# Patient Record
Sex: Male | Born: 1995 | Race: Black or African American | Hispanic: No | Marital: Single | Smoking: Never smoker
Health system: Southern US, Community
[De-identification: ages and names within clinical notes are randomized; demographics above are authoritative.]

---
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
## 2014-09-07 ENCOUNTER — Emergency Department (HOSPITAL_COMMUNITY): Payer: No Typology Code available for payment source

## 2014-09-07 ENCOUNTER — Encounter (HOSPITAL_COMMUNITY): Payer: Self-pay

## 2014-09-07 ENCOUNTER — Emergency Department (HOSPITAL_COMMUNITY)
Admission: EM | Admit: 2014-09-07 | Discharge: 2014-09-08 | Disposition: A | Discharge status: Home / Self Care | Payer: No Typology Code available for payment source | Attending: Emergency Medicine | Admitting: Emergency Medicine

## 2014-09-07 DIAGNOSIS — N50819 Testicular pain, unspecified: Secondary | ICD-10-CM

## 2014-09-07 DIAGNOSIS — Z880 Allergy status to penicillin: Secondary | ICD-10-CM | POA: Insufficient documentation

## 2014-09-07 DIAGNOSIS — N508 Other specified disorders of male genital organs: Secondary | ICD-10-CM | POA: Insufficient documentation

## 2014-09-07 DIAGNOSIS — Z79899 Other long term (current) drug therapy: Secondary | ICD-10-CM | POA: Insufficient documentation

## 2014-09-07 LAB — URINALYSIS, ROUTINE W REFLEX MICROSCOPIC
Glucose, UA: NEGATIVE mg/dL
Hgb urine dipstick: NEGATIVE
Ketones, ur: 15 mg/dL — AB
Leukocytes, UA: NEGATIVE
Nitrite: NEGATIVE
Protein, ur: NEGATIVE mg/dL
Specific Gravity, Urine: 1.039 — ABNORMAL HIGH (ref 1.005–1.030)
Urobilinogen, UA: 1 mg/dL (ref 0.0–1.0)
pH: 5.5 (ref 5.0–8.0)

## 2014-09-07 LAB — I-STAT CHEM 8, ED
BUN: 13 mg/dL (ref 6–23)
Calcium, Ion: 1.23 mmol/L (ref 1.12–1.23)
Chloride: 105 mmol/L (ref 96–112)
Creatinine, Ser: 0.8 mg/dL (ref 0.50–1.35)
Glucose, Bld: 82 mg/dL (ref 70–99)
HCT: 43 % (ref 39.0–52.0)
Hemoglobin: 14.6 g/dL (ref 13.0–17.0)
Potassium: 3.7 mmol/L (ref 3.5–5.1)
Sodium: 142 mmol/L (ref 135–145)
TCO2: 23 mmol/L (ref 0–100)

## 2014-09-07 NOTE — ED Notes (Signed)
Pt reporting left testicle pain that started 3-4 weeks ago.  Pt sts pain has progressively gotten worse.  Sts going to the restroom helps relieve the pain.

## 2014-09-07 NOTE — ED Provider Notes (Signed)
Complains of pain at left posterior testicle onset 3 weeks ago intermittent improved with standing upright and with urinating. No fever. No discharge. No other associated symptoms. He is asymptomatic as I examine him on exam no distress abdomen is obese genitalia normal male testes and normal lie and nontender no masses palpated  Doug SouSam Vannie Hochstetler, MD 09/07/14 [phone]

## 2014-09-07 NOTE — ED Notes (Signed)
Called for triage x2 with no response 

## 2014-09-08 MED ORDER — CEFIXIME 400 MG PO TABS
400.0000 mg | ORAL_TABLET | Freq: Once | ORAL | Status: AC
  Administered 2014-09-08: 400 mg via ORAL
  Filled 2014-09-08: qty 1

## 2014-09-08 MED ORDER — DOXYCYCLINE HYCLATE 100 MG PO CAPS: 100.0000 mg | ORAL_CAPSULE | Freq: Two times a day (BID) | ORAL | Status: AC

## 2014-09-08 MED ORDER — HYDROCODONE-ACETAMINOPHEN 5-325 MG PO TABS: 1.0000 | ORAL_TABLET | Freq: Four times a day (QID) | ORAL | Status: DC | PRN

## 2014-09-08 NOTE — Discharge Instructions (Signed)
Return here as needed. Follow up with the urologist provided. Call Monday for appointment.

## 2014-09-08 NOTE — ED Provider Notes (Signed)
CSN: [id number]     Arrival date & time 09/07/14  1846 History   First MD Initiated Contact with Patient 09/07/14 2120     Chief Complaint  Patient presents with  . Testicle Pain     (Consider location/radiation/quality/duration/timing/severity/associated sxs/prior Treatment) HPI Patient presents to the emergency department with left testicular pain has been ongoing for 3 weeks.  The patient states that it improves intermittently.  Patient states it improves with standing and urinating.  Patient denies any penile discharge or hematuria.  He should states that palpation makes the pain worse.  Patient denies abdominal pain, nausea, vomiting, fever, weakness, dizziness, headache, blurred vision, neck pain, neck pain, chest pain or shortness of breath History reviewed. No pertinent past medical history. History reviewed. No pertinent past surgical history. History reviewed. No pertinent family history. History  Substance Use Topics  . Smoking status: Never Smoker   . Smokeless tobacco: Not on file  . Alcohol Use: No    Review of Systems  All other systems negative except as documented in the HPI. All pertinent positives and negatives as reviewed in the HPI.  Allergies  Amoxicillin  Home Medications   Prior to Admission medications   Medication Sig Start Date End Date Taking? Authorizing Provider  ibuprofen (ADVIL,MOTRIN) 200 MG tablet Take 400 mg by mouth every 6 (six) hours as needed for mild pain.   Yes Historical Provider, MD  albuterol (PROVENTIL HFA;VENTOLIN HFA) 108 (90 BASE) MCG/ACT inhaler Inhale 1-2 puffs into the lungs every 6 (six) hours as needed for wheezing. 07/29/11 07/28/12  Jimmie MollyPaolo Coll, MD  doxycycline (VIBRAMYCIN) 100 MG capsule Take 1 capsule (100 mg total) by mouth 2 (two) times daily. 09/08/14   Charlestine Nighthristopher Aubery Date, PA-C  fexofenadine (ALLEGRA) 60 MG tablet Take 1 tablet (60 mg total) by mouth 2 (two) times daily. 07/29/11 07/28/12  Jimmie MollyPaolo Coll, MD    HYDROcodone-acetaminophen (NORCO/VICODIN) 5-325 MG per tablet Take 1 tablet by mouth every 6 (six) hours as needed for moderate pain. 09/08/14   Charlestine Nighthristopher Srihari Shellhammer, PA-C  Naproxen Sodium (ALEVE PO) Take by mouth.    Historical Provider, MD   BP 116/64 mmHg  Pulse 79  Temp(Src) 97.9 F (36.6 C) (Oral)  Resp 16  Ht 5\' 10"  (1.778 m)  Wt 260 lb (117.935 kg)  BMI 37.31 kg/m2  SpO2 97% Physical Exam  Constitutional: He is oriented to person, place, and time. He appears well-developed. No distress.  HENT:  Head: Normocephalic and atraumatic.  Mouth/Throat: Oropharynx is clear and moist.  Eyes: Pupils are equal, round, and reactive to light.  Neck: Normal range of motion. Neck supple.  Cardiovascular: Normal rate, regular rhythm and normal heart sounds.  Exam reveals no gallop and no friction rub.   No murmur heard. Pulmonary/Chest: Effort normal and breath sounds normal.  Abdominal: Hernia confirmed negative in the left inguinal area.  Genitourinary: Penis normal.    Left testis shows swelling and tenderness. Left testis shows no mass. Left testis is descended.  Neurological: He is alert and oriented to person, place, and time. He exhibits normal muscle tone. Coordination normal.  Skin: Skin is warm and dry. No rash noted. No erythema.    ED Course  Procedures (including critical care time) Labs Review Labs Reviewed  URINALYSIS, ROUTINE W REFLEX MICROSCOPIC - Abnormal; Notable for the following:    Color, Urine AMBER (*)    Specific Gravity, Urine 1.039 (*)    Bilirubin Urine SMALL (*)    Ketones, ur 15 (*)  All other components within normal limits  I-STAT CHEM 8, ED    Imaging Review US Scrotum  09/07/2014   CLINICAL DATA:  Left testicular pain for several weeks. Initial encounter.  EXAM: SCROTAL ULTRASOUND  DOPPLER ULTRASOUND OF THE TESTICLES  TECHNIQUE: Complete ultrasound examination of the testicles, epididymis, and other scrotal structures was performed. Color and  spectral Doppler ultrasound were also utilized to evaluate blood flow to the testicles.  COMPARISON:  None.  FINDINGS: Right testicle  Measurements: 4.8 x 2.5 x 3.4 cm. No mass or microlithiasis visualized.  Left testicle  Measurements: 4.3 x 2.5 x 3.0 cm. Several small hypoechoic foci are noted within the left testis, measuring up to 3 mm in size. No abnormal blood flow is noted on limited Doppler evaluation. This appearance can be seen with testicular fibrosis or possibly multiple small infarcts, though tumor cannot be excluded. Sarcoidosis is thought to be less likely given the unilateral nature of the finding.  Right epididymis:  Normal in size and appearance.  Left epididymis:  Normal in size and appearance.  Hydrocele:  None visualized.  Varicocele:  None visualized.  There appears to be a few prominent left scrotal varices.  Pulsed Doppler interrogation of both testes demonstrates normal low resistance arterial and venous waveforms bilaterally.  IMPRESSION: 1. No evidence of testicular torsion. 2. Several small hypoechoic foci seen within the left testis, measuring up to 3 mm in size. No associated abnormal blood flow is seen. This appearance can be seen with testicular fibrosis or possibly multiple small infarcts, though tumor cannot be excluded. Sarcoidosis is thought to be less likely given the unilateral nature of the finding. Would correlate for any history of recent trauma. MRI would be helpful for further evaluation, as deemed clinically appropriate. 3. Apparent prominent left scrotal varices noted.   Electronically Signed   By: Roanna Raider M.D.   On: 09/07/2014 23:09   [place] Art/ven Flow Abd Pelv Doppler  09/07/2014   CLINICAL DATA:  Left testicular pain for several weeks. Initial encounter.  EXAM: SCROTAL ULTRASOUND  DOPPLER ULTRASOUND OF THE TESTICLES  TECHNIQUE: Complete ultrasound examination of the testicles, epididymis, and other scrotal structures was performed. Color and spectral Doppler  ultrasound were also utilized to evaluate blood flow to the testicles.  COMPARISON:  None.  FINDINGS: Right testicle  Measurements: 4.8 x 2.5 x 3.4 cm. No mass or microlithiasis visualized.  Left testicle  Measurements: 4.3 x 2.5 x 3.0 cm. Several small hypoechoic foci are noted within the left testis, measuring up to 3 mm in size. No abnormal blood flow is noted on limited Doppler evaluation. This appearance can be seen with testicular fibrosis or possibly multiple small infarcts, though tumor cannot be excluded. Sarcoidosis is thought to be less likely given the unilateral nature of the finding.  Right epididymis:  Normal in size and appearance.  Left epididymis:  Normal in size and appearance.  Hydrocele:  None visualized.  Varicocele:  None visualized.  There appears to be a few prominent left scrotal varices.  Pulsed Doppler interrogation of both testes demonstrates normal low resistance arterial and venous waveforms bilaterally.  IMPRESSION: 1. No evidence of testicular torsion. 2. Several small hypoechoic foci seen within the left testis, measuring up to 3 mm in size. No associated abnormal blood flow is seen. This appearance can be seen with testicular fibrosis or possibly multiple small infarcts, though tumor cannot be excluded. Sarcoidosis is thought to be less likely given the unilateral nature of the finding.  Would correlate for any history of recent trauma. MRI would be helpful for further evaluation, as deemed clinically appropriate. 3. Apparent prominent left scrotal varices noted.   Electronically Signed   By: Roanna Raider M.D.   On: 09/07/2014 23:09     EKG Interpretation None      MDM   Final diagnoses:  Testicle pain   Patient will be treated for epididymitis/orchitis talked with the urologist on-call, Dr. Marlou Porch, and he will see the patient in follow-up.  He also agreed with the plan to treat for epididymitis    Charlestine Night, PA-C 09/10/14 1610  Doug Sou,  MD 09/12/14 [phone]

## 2020-01-30 ENCOUNTER — Other Ambulatory Visit: Payer: Self-pay | Admitting: Family Medicine

## 2020-01-30 DIAGNOSIS — K429 Umbilical hernia without obstruction or gangrene: Secondary | ICD-10-CM

## 2020-02-09 ENCOUNTER — Other Ambulatory Visit: Payer: Self-pay

## 2020-02-24 ENCOUNTER — Emergency Department (HOSPITAL_COMMUNITY)
Admission: EM | Admit: 2020-02-24 | Discharge: 2020-02-24 | Disposition: A | Discharge status: Home / Self Care | Payer: 59 | Attending: Emergency Medicine | Admitting: Emergency Medicine

## 2020-02-24 ENCOUNTER — Encounter (HOSPITAL_COMMUNITY): Payer: Self-pay | Admitting: Emergency Medicine

## 2020-02-24 ENCOUNTER — Other Ambulatory Visit: Payer: Self-pay

## 2020-02-24 ENCOUNTER — Emergency Department (HOSPITAL_COMMUNITY): Payer: 59

## 2020-02-24 DIAGNOSIS — K4091 Unilateral inguinal hernia, without obstruction or gangrene, recurrent: Secondary | ICD-10-CM | POA: Diagnosis not present

## 2020-02-24 DIAGNOSIS — K439 Ventral hernia without obstruction or gangrene: Secondary | ICD-10-CM

## 2020-02-24 DIAGNOSIS — K8020 Calculus of gallbladder without cholecystitis without obstruction: Secondary | ICD-10-CM

## 2020-02-24 DIAGNOSIS — Z79899 Other long term (current) drug therapy: Secondary | ICD-10-CM | POA: Diagnosis not present

## 2020-02-24 DIAGNOSIS — R109 Unspecified abdominal pain: Secondary | ICD-10-CM | POA: Diagnosis present

## 2020-02-24 DIAGNOSIS — K429 Umbilical hernia without obstruction or gangrene: Secondary | ICD-10-CM | POA: Insufficient documentation

## 2020-02-24 DIAGNOSIS — K4090 Unilateral inguinal hernia, without obstruction or gangrene, not specified as recurrent: Secondary | ICD-10-CM

## 2020-02-24 DIAGNOSIS — E669 Obesity, unspecified: Secondary | ICD-10-CM | POA: Diagnosis not present

## 2020-02-24 LAB — COMPREHENSIVE METABOLIC PANEL
ALT: 18 U/L (ref 0–44)
AST: 16 U/L (ref 15–41)
Albumin: 4.2 g/dL (ref 3.5–5.0)
Alkaline Phosphatase: 74 U/L (ref 38–126)
Anion gap: 9 (ref 5–15)
BUN: 10 mg/dL (ref 6–20)
CO2: 25 mmol/L (ref 22–32)
Calcium: 9.4 mg/dL (ref 8.9–10.3)
Chloride: 105 mmol/L (ref 98–111)
Creatinine, Ser: 0.79 mg/dL (ref 0.61–1.24)
GFR calc Af Amer: >60 mL/min (ref >60)
GFR calc non Af Amer: >60 mL/min (ref >60)
Glucose, Bld: 92 mg/dL (ref 70–99)
Potassium: 3.9 mmol/L (ref 3.5–5.1)
Sodium: 139 mmol/L (ref 135–145)
Total Bilirubin: 0.7 mg/dL (ref 0.3–1.2)
Total Protein: 7.5 g/dL (ref 6.5–8.1)

## 2020-02-24 LAB — CBC
HCT: 44.8 % (ref 39.0–52.0)
Hemoglobin: 14 g/dL (ref 13.0–17.0)
MCH: 27.9 pg (ref 26.0–34.0)
MCHC: 31.3 g/dL (ref 30.0–36.0)
MCV: 89.2 fL (ref 80.0–100.0)
Platelets: 397 10*3/uL (ref 150–400)
RBC: 5.02 MIL/uL (ref 4.22–5.81)
RDW: 12 % (ref 11.5–15.5)
WBC: 6.3 10*3/uL (ref 4.0–10.5)
nRBC: 0 % (ref 0.0–0.2)

## 2020-02-24 LAB — LIPASE, BLOOD: Lipase: 21 U/L (ref 11–51)

## 2020-02-24 MED ORDER — HYDROCODONE-ACETAMINOPHEN 5-325 MG PO TABS: 1.0000 | ORAL_TABLET | Freq: Four times a day (QID) | ORAL | 0 refills | Status: AC | PRN

## 2020-02-24 MED ORDER — IOHEXOL 300 MG/ML  SOLN
100.0000 mL | Freq: Once | INTRAMUSCULAR | Status: AC | PRN
  Administered 2020-02-24: 100 mL via INTRAVENOUS

## 2020-02-24 MED ORDER — OMEPRAZOLE 20 MG PO CPDR: 20.0000 mg | DELAYED_RELEASE_CAPSULE | Freq: Every day | ORAL | 0 refills | Status: AC

## 2020-02-24 NOTE — ED Triage Notes (Signed)
C/o upper abd pain x 2 weeks.  States over the last 3 days pain is now generalized.  Denies nausea, vomiting, and diarrhea.

## 2020-02-24 NOTE — ED Notes (Signed)
Updated on wait for treatment room. 

## 2020-02-24 NOTE — ED Provider Notes (Signed)
[facility] EMERGENCY DEPARTMENT Provider Note   CSN: [id number] Arrival date & time: 02/24/20  1424     History Chief Complaint  Patient presents with  . Abdominal Pain    Patrick Parks is a 24 y.o. male without significant past medical history, presenting to the emergency department with complaint of epigastric abdominal pain that has been ongoing for few months now, however worsened 2 days ago.  Pain is described as a gas cramping pain. He also has some indigestion. He states his symptoms are worse after eating a large meal.   He denies nausea, vomiting, heartburn, diarrhea, constipation, urinary symptoms, fevers. No medications taken for symptoms. He was being worked up outpatient by his PCP for this pain.  He states he had an ultrasound that was not forthcoming.  He was scheduled for an outpatient CT scan, however something happened with the imaging place and his imaging was canceled and not rescheduled.  His PCP mentions something about a hernia, however he is unsure of any more detail of this.    The history is provided by the patient.       History reviewed. No pertinent past medical history.  There are no problems to display for this patient.   History reviewed. No pertinent surgical history.     No family history on file.  Social History   Tobacco Use  . Smoking status: Never Smoker  . Smokeless tobacco: Never Used  Substance Use Topics  . Alcohol use: No  . Drug use: No    Home Medications Prior to Admission medications   Medication Sig Start Date End Date Taking? Authorizing Provider  albuterol (PROVENTIL HFA;VENTOLIN HFA) 108 (90 BASE) MCG/ACT inhaler Inhale 1-2 puffs into the lungs every 6 (six) hours as needed for wheezing. 07/29/11 07/28/12  Jimmie Molly, MD  doxycycline (VIBRAMYCIN) 100 MG capsule Take 1 capsule (100 mg total) by mouth 2 (two) times daily. 09/08/14   Lawyer, Cristal Deer, PA-C  fexofenadine (ALLEGRA) 60 MG tablet Take 1  tablet (60 mg total) by mouth 2 (two) times daily. 07/29/11 07/28/12  Jimmie Molly, MD  HYDROcodone-acetaminophen (NORCO/VICODIN) 5-325 MG tablet Take 1 tablet by mouth every 6 (six) hours as needed for severe pain. 02/24/20   Jarick Harkins, [place] N, PA-C  ibuprofen (ADVIL,MOTRIN) 200 MG tablet Take 400 mg by mouth every 6 (six) hours as needed for mild pain.    [provider]  Naproxen Sodium (ALEVE PO) Take by mouth.    [provider]  omeprazole (PRILOSEC) 20 MG capsule Take 1 capsule (20 mg total) by mouth daily. 02/24/20   Oveda Dadamo, [place] N, PA-C    Allergies    Amoxicillin  Review of Systems   Review of Systems  Gastrointestinal: Positive for abdominal pain.  All other systems reviewed and are negative.   Physical Exam Updated Vital Signs BP (!) 133/91   Pulse 86   Temp 99 F (37.2 C)   Resp (!) 22   SpO2 98%   Physical Exam Vitals and nursing note reviewed.  Constitutional:      General: He is not in acute distress.    Appearance: He is well-developed. He is obese.  HENT:     Head: Normocephalic and atraumatic.  Eyes:     Conjunctiva/sclera: Conjunctivae normal.  Cardiovascular:     Rate and Rhythm: Normal rate and regular rhythm.  Pulmonary:     Effort: Pulmonary effort is normal. No respiratory distress.     Breath sounds: Normal breath  sounds.  Abdominal:     General: Bowel sounds are normal.     Palpations: Abdomen is soft.     Tenderness: There is abdominal tenderness in the right upper quadrant and epigastric area. There is no guarding or rebound.  Skin:    General: Skin is warm.  Neurological:     Mental Status: He is alert.  Psychiatric:        Behavior: Behavior normal.     ED Results / Procedures / Treatments   Labs (all labs ordered are listed, but only abnormal results are displayed) Labs Reviewed  LIPASE, BLOOD  COMPREHENSIVE METABOLIC PANEL  CBC    EKG None  Radiology CT Abdomen Pelvis W Contrast  Result Date:  02/24/2020 CLINICAL DATA:  Acute upper abdominal pain EXAM: CT ABDOMEN AND PELVIS WITH CONTRAST TECHNIQUE: Multidetector CT imaging of the abdomen and pelvis was performed using the standard protocol following bolus administration of intravenous contrast. Sagittal and coronal MPR images reconstructed from axial data set. CONTRAST:  OMNIPAQUE IOHEXOL 300 MG/ML SOLN IV. No oral contrast. COMPARISON:  None FINDINGS: Lower chest: Lung bases clear Hepatobiliary: Multiple dependent calcified gallstones in gallbladder. Nonspecific 9 mm low-attenuation focus RIGHT lobe liver image 15. Gallbladder and liver otherwise normal appearance. No biliary dilatation. Pancreas: Normal appearance Spleen: Normal appearance Adrenals/Urinary Tract: Adrenal glands, kidneys, ureters, and bladder normal appearance Stomach/Bowel: Normal appendix. Slightly prominent stool at the rectosigmoid colon. Stomach and bowel loops otherwise normal appearance. Vascular/Lymphatic: Aorta normal caliber. Vascular structures patent. No adenopathy. Reproductive: Unremarkable prostate gland Other: Small LEFT inguinal hernia containing fat. Small umbilical hernia containing fat. Larger supraumbilical ventral hernia containing fat, with mild associated stranding at the properitoneal fat immediately deep to the fascial defect, could represent a degree of mild edema/inflammation. No free air or free fluid. Musculoskeletal: Osseous structures unremarkable. IMPRESSION: Cholelithiasis. Small LEFT inguinal and umbilical hernias containing fat. Larger supraumbilical ventral hernia containing fat, with mild associated stranding at the properitoneal fat immediately deep to the fascial defect, could represent a degree of edema/inflammation. No other intra-abdominal or intrapelvic abnormalities. Electronically Signed   By: Ulyses Southward M.D.   On: 02/24/2020 19:06    Procedures Procedures (including critical care time)  Medications Ordered in ED Medications    iohexol (OMNIPAQUE) 300 MG/ML solution 100 mL (100 mLs Intravenous Contrast Given 02/24/20 1838)    ED Course  I have reviewed the triage vital signs and the nursing notes.  Pertinent labs & imaging results that were available during my care of the patient were reviewed by me and considered in my medical decision making (see chart for details).    MDM Rules/Calculators/A&P                          Patient presenting with upper abdominal pain, worse after large meals.  This has been ongoing for multiple months, worked up outpatient.  He was pending outpatient CT scan, however something abnormal scheduling and has not been done.  He is not have any vomiting or bowel changes, no urinary symptoms or fevers.  On exam he is well-appearing and in no distress.  He is morbidly obese.  Abdomen with tenderness to the epigastrium and right upper quadrant, no peritoneal signs.  Labs are reassuring and within normal limits including lipase, LFTs, renal function, normal white count. As patient said he had outpatient ultrasound done, will perform CT scan for further evaluation of unknown hernia.  Differential diagnosis does include cholelithiasis,  gastritis, PUD.  CT with fat-containing umbilical hernia, ventral hernia, left inguinal hernia.  There is also cholelithiasis without evidence of cholecystitis.  No other acute findings.  Suspect most of symptoms are related to cholelithiasis given history.  He is tender at the site of the ventral hernia.  Will provide general surgery referral to discuss possible repair of hernia, as well as to discuss cholelithiasis.  Patient will be prescribed pain medication pain related to cholelithiasis.  He is instructed of diet modifications.  We will also prescribe PPI.  Patient is instructed to return if symptoms worsen.  Patient is well-appearing, agreeable to plan and appropriate for discharge.  Final Clinical Impression(s) / ED Diagnoses Final diagnoses:  Calculus of  gallbladder without cholecystitis without obstruction  Ventral hernia without obstruction or gangrene  Umbilical hernia without obstruction and without gangrene  Left inguinal hernia    Rx / DC Orders ED Discharge Orders         Ordered    omeprazole (PRILOSEC) 20 MG capsule  Daily        02/24/20 1935    HYDROcodone-acetaminophen (NORCO/VICODIN) 5-325 MG tablet  Every 6 hours PRN        02/24/20 1935           Lasheka Kempner, [place] N, PA-C 02/24/20 2017    Wynetta Fines, MD 02/27/20 [phone]

## 2020-02-24 NOTE — Discharge Instructions (Signed)
It is recommended that you modify your diet to help with symptoms of gallstones; do not eat very large meals, limit your greasy/fatty meals. You can treat your pain with over-the-counter medications as needed. You can take the hydrocodone for severe pain.  Be aware this medication can make you drowsy. Do not operate motor vehicles or drink alcohol while taking this medication. Take the Prilosec/omeprazole daily to help with acid reflux. Schedule appointment with general surgery to discuss treatment options of your gallstones and hernias. Return to the emergency department if you have sudden worsening pain associated with your hernias, the stop having bowel movements or have uncontrollable vomiting.  Return if you have fever, worsening pain in the right upper abdomen that is not controlled with pain medication, uncontrollable vomiting.

## 2021-06-30 IMAGING — CT CT ABD-PELV W/ CM
2 of 4 series · 16 of 46 positions shown, 18 images · IV contrast (omnipaque)
Comparison: None

CLINICAL DATA: Acute upper abdominal pain

EXAM:
CT ABDOMEN AND PELVIS WITH CONTRAST
TECHNIQUE: Multidetector CT imaging of the abdomen and pelvis was performed
using the standard protocol following bolus administration of
intravenous contrast. Sagittal and coronal MPR images reconstructed
from axial data set.
CONTRAST:  100mL OMNIPAQUE IOHEXOL 300 MG/ML SOLN IV. No oral
contrast.

[Series 3: a/p w/ 5mm · axial · 0.98mm/px · z∈[-579,-84]mm · 13 of 109 slices shown, 15 images]
[im 5/109  soft-tissue]
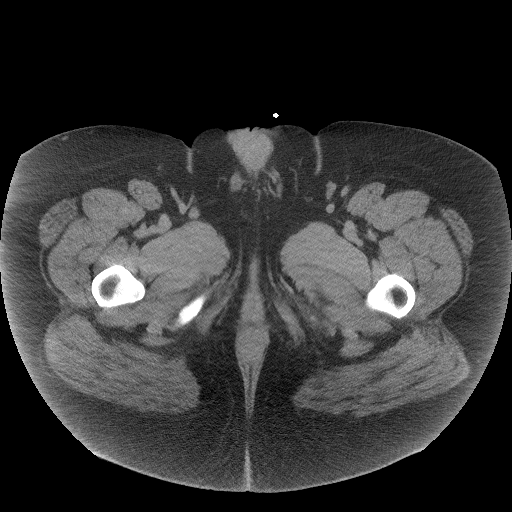
[im 5/109  bone]
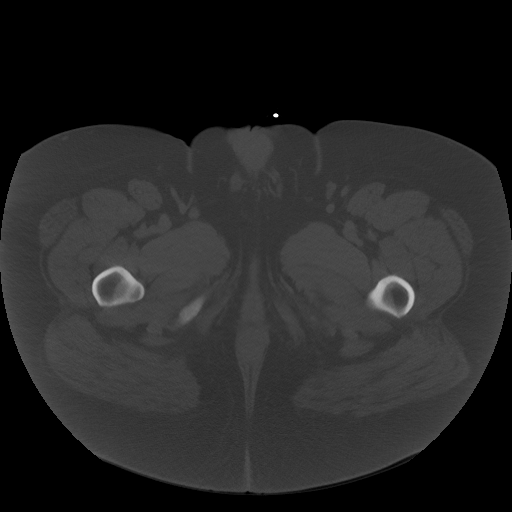
[im 13/109  soft-tissue]
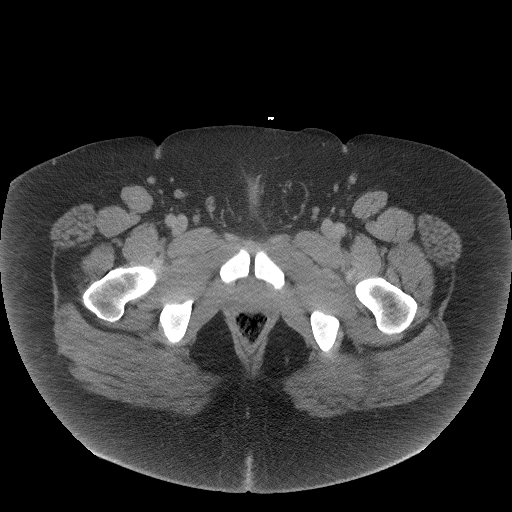
[im 22/109  soft-tissue]
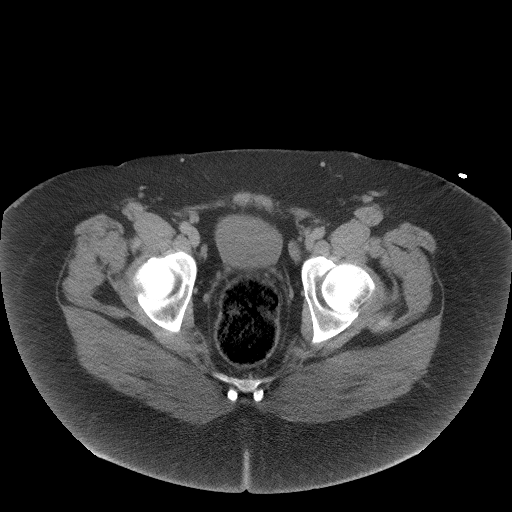
[im 31/109  soft-tissue]
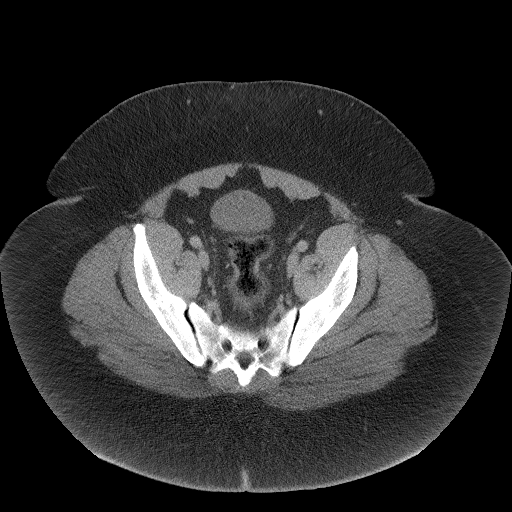
[im 39/109  soft-tissue]
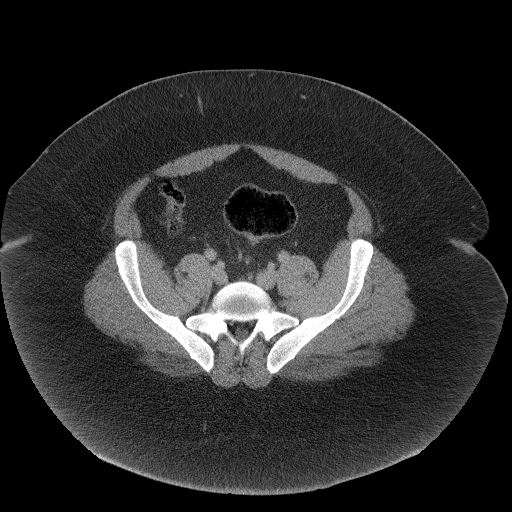
[im 48/109  soft-tissue]
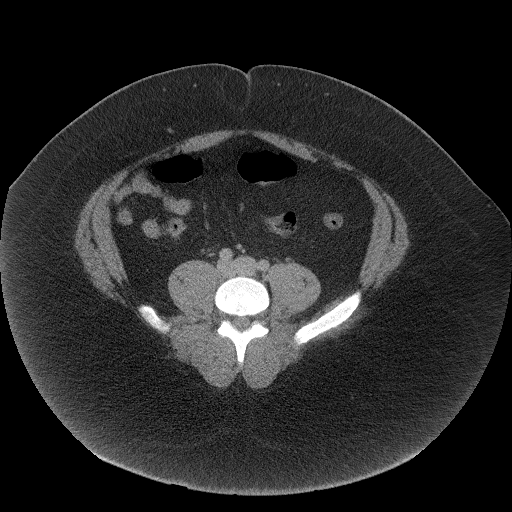
[im 57/109  soft-tissue]
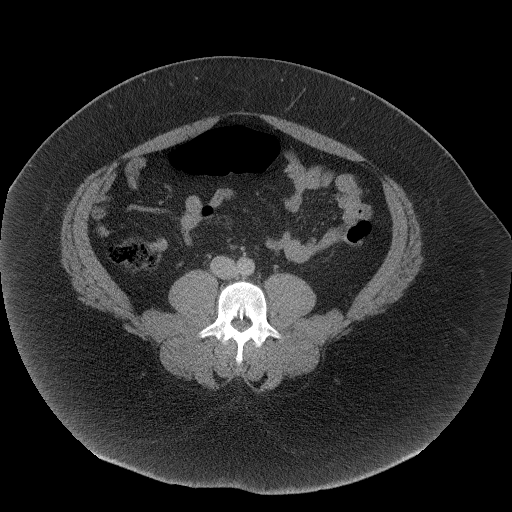
[im 61/109  soft-tissue]
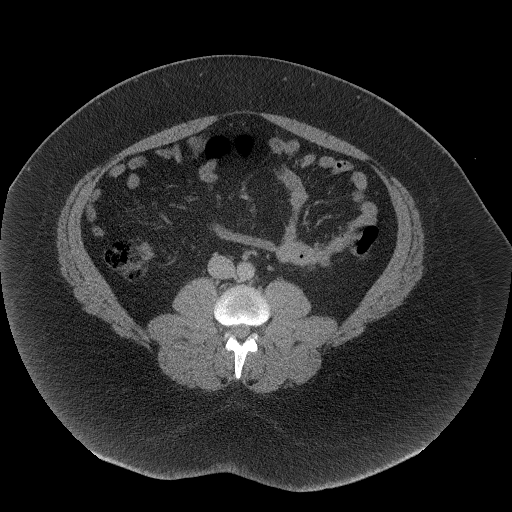
[im 70/109  soft-tissue]
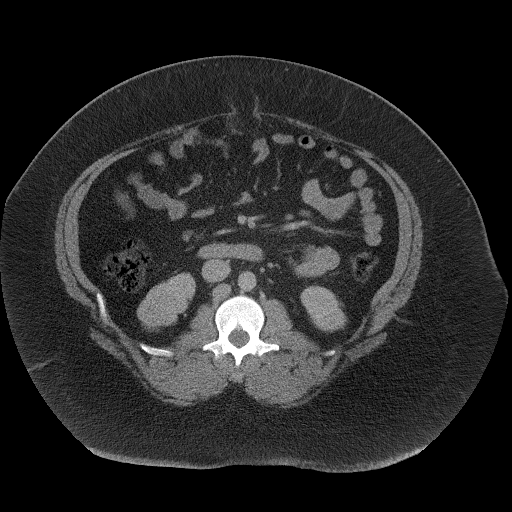
[im 70/109  bone]
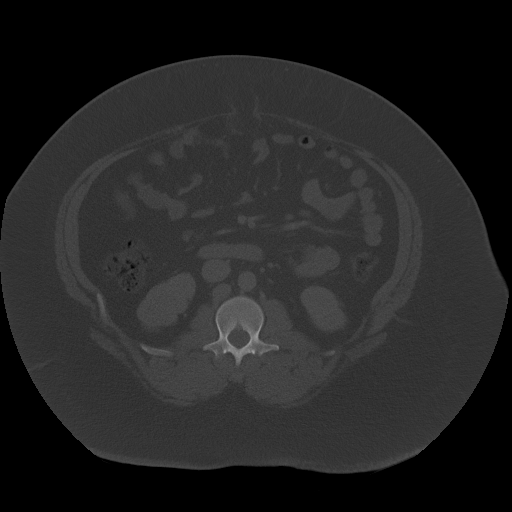
[im 78/109  soft-tissue]
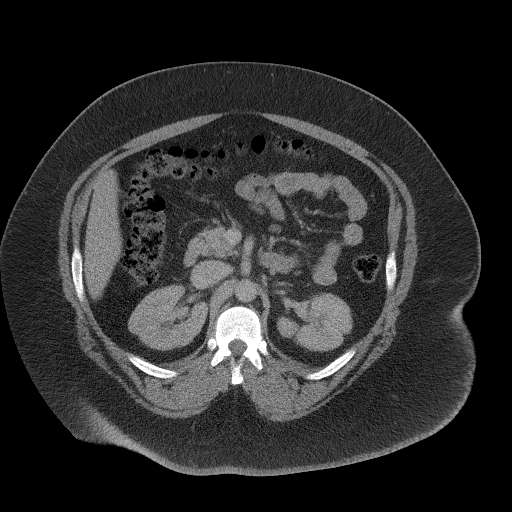
[im 87/109  soft-tissue]
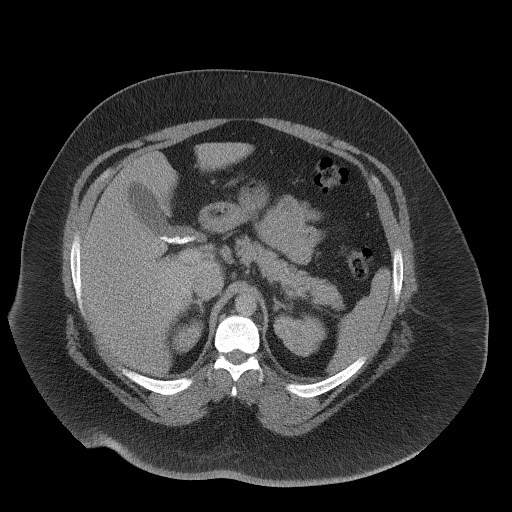
[im 96/109  soft-tissue]
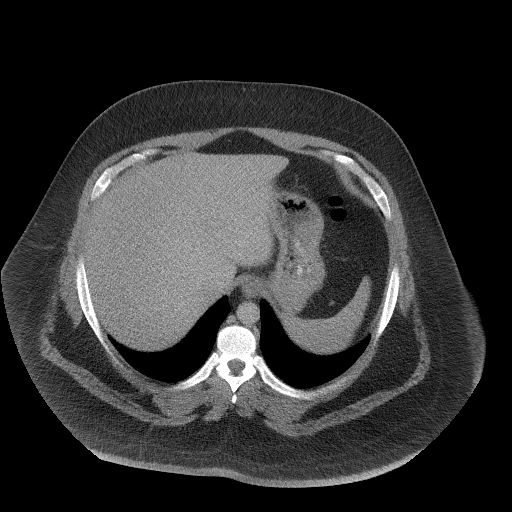
[im 104/109  soft-tissue]
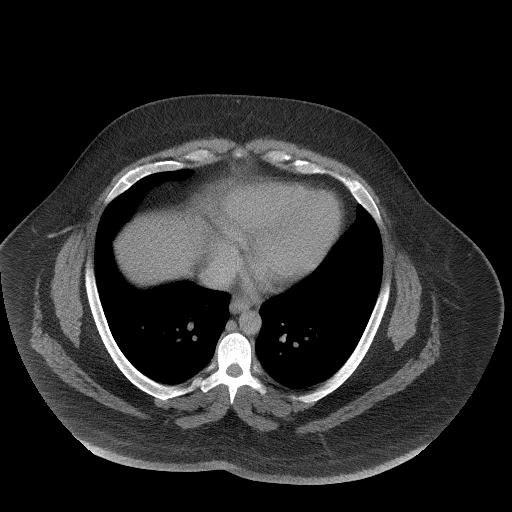

[Series 6: a/p w/ cor · coronal · 0.95mm/px · 3 of 188 slices shown]
[im 63/188  soft-tissue]
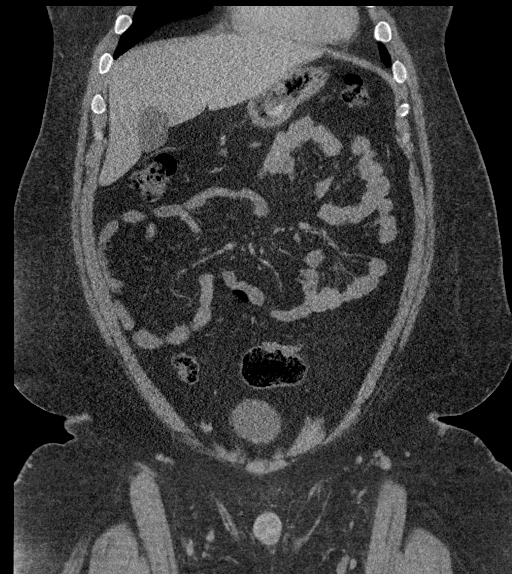
[im 84/188  soft-tissue]
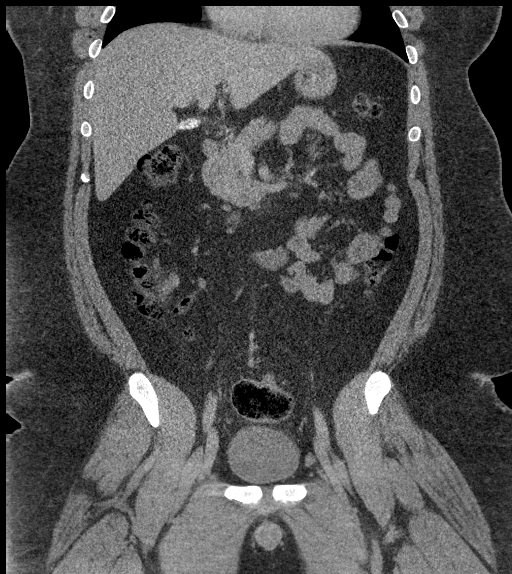
[im 104/188  soft-tissue]
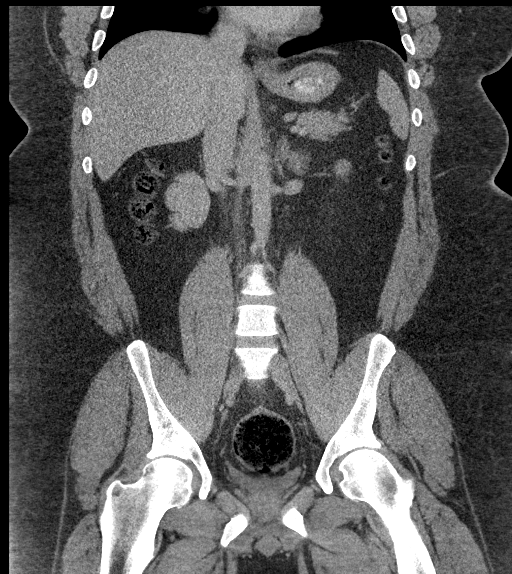

[16 of 46 positions shown; findings below may reference images not displayed]

FINDINGS: Lower chest: Lung bases clear

Hepatobiliary: Multiple dependent calcified gallstones in
gallbladder. Nonspecific 9 mm low-attenuation focus RIGHT lobe liver
image 15. Gallbladder and liver otherwise normal appearance. No
biliary dilatation.

Pancreas: Normal appearance

Spleen: Normal appearance

Adrenals/Urinary Tract: Adrenal glands, kidneys, ureters, and
bladder normal appearance

Stomach/Bowel: Normal appendix. Slightly prominent stool at the
rectosigmoid colon. Stomach and bowel loops otherwise normal
appearance.

Vascular/Lymphatic: Aorta normal caliber. Vascular structures
patent. No adenopathy.

Reproductive: Unremarkable prostate gland

Other: Small LEFT inguinal hernia containing fat. Small umbilical
hernia containing fat. Larger supraumbilical ventral hernia
containing fat, with mild associated stranding at the properitoneal
fat immediately deep to the fascial defect, could represent a degree
of mild edema/inflammation. No free air or free fluid.

Musculoskeletal: Osseous structures unremarkable.
IMPRESSION: Cholelithiasis.

Small LEFT inguinal and umbilical hernias containing fat.

Larger supraumbilical ventral hernia containing fat, with mild
associated stranding at the properitoneal fat immediately deep to
the fascial defect, could represent a degree of edema/inflammation.

No other intra-abdominal or intrapelvic abnormalities.

## 2021-07-02 DIAGNOSIS — R3589 Other polyuria: Secondary | ICD-10-CM | POA: Diagnosis not present

## 2021-07-02 DIAGNOSIS — Z6841 Body Mass Index (BMI) 40.0 and over, adult: Secondary | ICD-10-CM | POA: Diagnosis not present

## 2021-07-02 DIAGNOSIS — K429 Umbilical hernia without obstruction or gangrene: Secondary | ICD-10-CM | POA: Diagnosis not present

## 2021-07-02 DIAGNOSIS — G4730 Sleep apnea, unspecified: Secondary | ICD-10-CM | POA: Diagnosis not present

## 2021-07-02 DIAGNOSIS — I10 Essential (primary) hypertension: Secondary | ICD-10-CM | POA: Diagnosis not present

## 2021-07-02 DIAGNOSIS — Z1159 Encounter for screening for other viral diseases: Secondary | ICD-10-CM | POA: Diagnosis not present

## 2021-07-02 DIAGNOSIS — R7309 Other abnormal glucose: Secondary | ICD-10-CM | POA: Diagnosis not present

## 2021-07-02 DIAGNOSIS — E559 Vitamin D deficiency, unspecified: Secondary | ICD-10-CM | POA: Diagnosis not present

## 2022-01-09 DIAGNOSIS — R399 Unspecified symptoms and signs involving the genitourinary system: Secondary | ICD-10-CM | POA: Diagnosis not present

## 2022-01-09 DIAGNOSIS — Z6841 Body Mass Index (BMI) 40.0 and over, adult: Secondary | ICD-10-CM | POA: Diagnosis not present

## 2022-01-09 DIAGNOSIS — Z8719 Personal history of other diseases of the digestive system: Secondary | ICD-10-CM | POA: Diagnosis not present

## 2022-01-09 DIAGNOSIS — R030 Elevated blood-pressure reading, without diagnosis of hypertension: Secondary | ICD-10-CM | POA: Diagnosis not present

## 2022-01-19 DIAGNOSIS — Z6841 Body Mass Index (BMI) 40.0 and over, adult: Secondary | ICD-10-CM | POA: Diagnosis not present

## 2022-01-19 DIAGNOSIS — N50812 Left testicular pain: Secondary | ICD-10-CM | POA: Diagnosis not present

## 2022-01-26 DIAGNOSIS — N50812 Left testicular pain: Secondary | ICD-10-CM | POA: Diagnosis not present

## 2022-01-26 DIAGNOSIS — R351 Nocturia: Secondary | ICD-10-CM | POA: Diagnosis not present

## 2022-01-26 DIAGNOSIS — N411 Chronic prostatitis: Secondary | ICD-10-CM | POA: Diagnosis not present

## 2022-02-03 DIAGNOSIS — Z0000 Encounter for general adult medical examination without abnormal findings: Secondary | ICD-10-CM | POA: Diagnosis not present

## 2022-02-03 DIAGNOSIS — Z6841 Body Mass Index (BMI) 40.0 and over, adult: Secondary | ICD-10-CM | POA: Diagnosis not present

## 2022-02-03 DIAGNOSIS — N50812 Left testicular pain: Secondary | ICD-10-CM | POA: Diagnosis not present

## 2022-02-03 DIAGNOSIS — R5383 Other fatigue: Secondary | ICD-10-CM | POA: Diagnosis not present

## 2022-02-03 DIAGNOSIS — R030 Elevated blood-pressure reading, without diagnosis of hypertension: Secondary | ICD-10-CM | POA: Diagnosis not present

## 2022-02-03 DIAGNOSIS — Z20822 Contact with and (suspected) exposure to covid-19: Secondary | ICD-10-CM | POA: Diagnosis not present

## 2022-02-03 DIAGNOSIS — Z1339 Encounter for screening examination for other mental health and behavioral disorders: Secondary | ICD-10-CM | POA: Diagnosis not present

## 2022-02-03 DIAGNOSIS — R0602 Shortness of breath: Secondary | ICD-10-CM | POA: Diagnosis not present

## 2022-02-03 DIAGNOSIS — E559 Vitamin D deficiency, unspecified: Secondary | ICD-10-CM | POA: Diagnosis not present

## 2022-02-03 DIAGNOSIS — Z1159 Encounter for screening for other viral diseases: Secondary | ICD-10-CM | POA: Diagnosis not present

## 2022-02-03 DIAGNOSIS — Z1331 Encounter for screening for depression: Secondary | ICD-10-CM | POA: Diagnosis not present

## 2022-02-17 DIAGNOSIS — Z6841 Body Mass Index (BMI) 40.0 and over, adult: Secondary | ICD-10-CM | POA: Diagnosis not present

## 2022-02-17 DIAGNOSIS — N50812 Left testicular pain: Secondary | ICD-10-CM | POA: Diagnosis not present

## 2022-02-17 DIAGNOSIS — E559 Vitamin D deficiency, unspecified: Secondary | ICD-10-CM | POA: Diagnosis not present

## 2022-02-20 DIAGNOSIS — N50812 Left testicular pain: Secondary | ICD-10-CM | POA: Diagnosis not present

## 2022-02-20 DIAGNOSIS — N503 Cyst of epididymis: Secondary | ICD-10-CM | POA: Diagnosis not present

## 2022-05-05 DIAGNOSIS — R7309 Other abnormal glucose: Secondary | ICD-10-CM | POA: Diagnosis not present

## 2022-05-05 DIAGNOSIS — B009 Herpesviral infection, unspecified: Secondary | ICD-10-CM | POA: Diagnosis not present

## 2022-05-05 DIAGNOSIS — E559 Vitamin D deficiency, unspecified: Secondary | ICD-10-CM | POA: Diagnosis not present

## 2022-05-05 DIAGNOSIS — Z7251 High risk heterosexual behavior: Secondary | ICD-10-CM | POA: Diagnosis not present

## 2022-05-05 DIAGNOSIS — Z6841 Body Mass Index (BMI) 40.0 and over, adult: Secondary | ICD-10-CM | POA: Diagnosis not present

## 2022-05-05 DIAGNOSIS — Z0130 Encounter for examination of blood pressure without abnormal findings: Secondary | ICD-10-CM | POA: Diagnosis not present

## 2022-05-05 DIAGNOSIS — Z79899 Other long term (current) drug therapy: Secondary | ICD-10-CM | POA: Diagnosis not present

## 2022-07-20 ENCOUNTER — Other Ambulatory Visit (HOSPITAL_COMMUNITY): Payer: Self-pay | Admitting: Urology

## 2022-07-20 DIAGNOSIS — N503 Cyst of epididymis: Secondary | ICD-10-CM

## 2023-01-11 DIAGNOSIS — R82998 Other abnormal findings in urine: Secondary | ICD-10-CM | POA: Diagnosis not present

## 2023-01-11 DIAGNOSIS — R300 Dysuria: Secondary | ICD-10-CM | POA: Diagnosis not present

## 2023-01-11 DIAGNOSIS — Z6841 Body Mass Index (BMI) 40.0 and over, adult: Secondary | ICD-10-CM | POA: Diagnosis not present

## 2023-01-11 DIAGNOSIS — R351 Nocturia: Secondary | ICD-10-CM | POA: Diagnosis not present

## 2023-01-11 DIAGNOSIS — N390 Urinary tract infection, site not specified: Secondary | ICD-10-CM | POA: Diagnosis not present

## 2023-01-13 DIAGNOSIS — R3581 Nocturnal polyuria: Secondary | ICD-10-CM | POA: Diagnosis not present

## 2023-01-13 DIAGNOSIS — R030 Elevated blood-pressure reading, without diagnosis of hypertension: Secondary | ICD-10-CM | POA: Diagnosis not present

## 2023-01-13 DIAGNOSIS — Z6841 Body Mass Index (BMI) 40.0 and over, adult: Secondary | ICD-10-CM | POA: Diagnosis not present

## 2023-05-03 DIAGNOSIS — Z13220 Encounter for screening for lipoid disorders: Secondary | ICD-10-CM | POA: Diagnosis not present

## 2023-05-03 DIAGNOSIS — Z0001 Encounter for general adult medical examination with abnormal findings: Secondary | ICD-10-CM | POA: Diagnosis not present

## 2023-05-03 DIAGNOSIS — Z23 Encounter for immunization: Secondary | ICD-10-CM | POA: Diagnosis not present

## 2023-05-03 DIAGNOSIS — I10 Essential (primary) hypertension: Secondary | ICD-10-CM | POA: Diagnosis not present

## 2023-05-03 DIAGNOSIS — L819 Disorder of pigmentation, unspecified: Secondary | ICD-10-CM | POA: Diagnosis not present

## 2023-05-03 DIAGNOSIS — R351 Nocturia: Secondary | ICD-10-CM | POA: Diagnosis not present

## 2023-05-03 DIAGNOSIS — Z136 Encounter for screening for cardiovascular disorders: Secondary | ICD-10-CM | POA: Diagnosis not present

## 2023-05-03 DIAGNOSIS — G4733 Obstructive sleep apnea (adult) (pediatric): Secondary | ICD-10-CM | POA: Diagnosis not present

## 2023-06-02 ENCOUNTER — Other Ambulatory Visit: Payer: Self-pay | Admitting: Urology

## 2023-06-02 DIAGNOSIS — N3944 Nocturnal enuresis: Secondary | ICD-10-CM | POA: Diagnosis not present

## 2023-06-02 DIAGNOSIS — R93812 Abnormal radiologic findings on diagnostic imaging of left testicle: Secondary | ICD-10-CM

## 2023-06-02 DIAGNOSIS — R351 Nocturia: Secondary | ICD-10-CM | POA: Diagnosis not present

## 2023-06-08 ENCOUNTER — Ambulatory Visit
Admission: RE | Admit: 2023-06-08 | Discharge: 2023-06-08 | Disposition: A | Discharge status: Home / Self Care | Payer: BC Managed Care – PPO | Source: Ambulatory Visit | Attending: Urology | Admitting: Urology

## 2023-06-08 DIAGNOSIS — Z131 Encounter for screening for diabetes mellitus: Secondary | ICD-10-CM | POA: Diagnosis not present

## 2023-06-08 DIAGNOSIS — Z13220 Encounter for screening for lipoid disorders: Secondary | ICD-10-CM | POA: Diagnosis not present

## 2023-06-08 DIAGNOSIS — E559 Vitamin D deficiency, unspecified: Secondary | ICD-10-CM | POA: Diagnosis not present

## 2023-06-08 DIAGNOSIS — Z0001 Encounter for general adult medical examination with abnormal findings: Secondary | ICD-10-CM | POA: Diagnosis not present

## 2023-06-08 DIAGNOSIS — R93812 Abnormal radiologic findings on diagnostic imaging of left testicle: Secondary | ICD-10-CM

## 2023-06-08 DIAGNOSIS — Z113 Encounter for screening for infections with a predominantly sexual mode of transmission: Secondary | ICD-10-CM | POA: Diagnosis not present

## 2023-06-10 DIAGNOSIS — K439 Ventral hernia without obstruction or gangrene: Secondary | ICD-10-CM | POA: Diagnosis not present

## 2023-06-10 DIAGNOSIS — Z6841 Body Mass Index (BMI) 40.0 and over, adult: Secondary | ICD-10-CM | POA: Diagnosis not present

## 2023-06-10 DIAGNOSIS — E66813 Obesity, class 3: Secondary | ICD-10-CM | POA: Diagnosis not present

## 2023-06-14 DIAGNOSIS — K439 Ventral hernia without obstruction or gangrene: Secondary | ICD-10-CM | POA: Diagnosis not present

## 2023-06-14 DIAGNOSIS — K8020 Calculus of gallbladder without cholecystitis without obstruction: Secondary | ICD-10-CM | POA: Diagnosis not present

## 2023-06-14 DIAGNOSIS — K429 Umbilical hernia without obstruction or gangrene: Secondary | ICD-10-CM | POA: Diagnosis not present

## 2023-06-15 DIAGNOSIS — L819 Disorder of pigmentation, unspecified: Secondary | ICD-10-CM | POA: Diagnosis not present

## 2023-06-15 DIAGNOSIS — I10 Essential (primary) hypertension: Secondary | ICD-10-CM | POA: Diagnosis not present

## 2023-06-15 DIAGNOSIS — K439 Ventral hernia without obstruction or gangrene: Secondary | ICD-10-CM | POA: Diagnosis not present

## 2023-06-15 DIAGNOSIS — N3281 Overactive bladder: Secondary | ICD-10-CM | POA: Diagnosis not present

## 2023-06-17 DIAGNOSIS — Z6841 Body Mass Index (BMI) 40.0 and over, adult: Secondary | ICD-10-CM | POA: Diagnosis not present

## 2023-06-17 DIAGNOSIS — E66813 Obesity, class 3: Secondary | ICD-10-CM | POA: Diagnosis not present

## 2023-06-17 DIAGNOSIS — K439 Ventral hernia without obstruction or gangrene: Secondary | ICD-10-CM | POA: Diagnosis not present

## 2023-06-17 DIAGNOSIS — Z1331 Encounter for screening for depression: Secondary | ICD-10-CM | POA: Diagnosis not present

## 2023-07-22 DIAGNOSIS — Z713 Dietary counseling and surveillance: Secondary | ICD-10-CM | POA: Diagnosis not present

## 2023-07-22 DIAGNOSIS — K439 Ventral hernia without obstruction or gangrene: Secondary | ICD-10-CM | POA: Diagnosis not present

## 2023-07-23 DIAGNOSIS — Z6841 Body Mass Index (BMI) 40.0 and over, adult: Secondary | ICD-10-CM | POA: Diagnosis not present

## 2023-07-23 DIAGNOSIS — F54 Psychological and behavioral factors associated with disorders or diseases classified elsewhere: Secondary | ICD-10-CM | POA: Diagnosis not present

## 2023-07-23 DIAGNOSIS — Z7189 Other specified counseling: Secondary | ICD-10-CM | POA: Diagnosis not present

## 2023-07-26 DIAGNOSIS — G4733 Obstructive sleep apnea (adult) (pediatric): Secondary | ICD-10-CM | POA: Diagnosis not present

## 2023-07-26 DIAGNOSIS — Z713 Dietary counseling and surveillance: Secondary | ICD-10-CM | POA: Diagnosis not present

## 2023-07-26 DIAGNOSIS — Z6841 Body Mass Index (BMI) 40.0 and over, adult: Secondary | ICD-10-CM | POA: Diagnosis not present

## 2023-07-26 DIAGNOSIS — Z1331 Encounter for screening for depression: Secondary | ICD-10-CM | POA: Diagnosis not present

## 2023-07-26 DIAGNOSIS — E66813 Obesity, class 3: Secondary | ICD-10-CM | POA: Diagnosis not present

## 2023-09-07 DIAGNOSIS — D649 Anemia, unspecified: Secondary | ICD-10-CM | POA: Diagnosis not present

## 2023-09-07 DIAGNOSIS — Z91018 Allergy to other foods: Secondary | ICD-10-CM | POA: Diagnosis not present

## 2023-09-23 DIAGNOSIS — K439 Ventral hernia without obstruction or gangrene: Secondary | ICD-10-CM | POA: Diagnosis not present

## 2023-09-23 DIAGNOSIS — Z6841 Body Mass Index (BMI) 40.0 and over, adult: Secondary | ICD-10-CM | POA: Diagnosis not present

## 2023-09-23 DIAGNOSIS — E66813 Obesity, class 3: Secondary | ICD-10-CM | POA: Diagnosis not present

## 2023-10-05 DIAGNOSIS — E559 Vitamin D deficiency, unspecified: Secondary | ICD-10-CM | POA: Diagnosis not present

## 2023-10-05 DIAGNOSIS — N3281 Overactive bladder: Secondary | ICD-10-CM | POA: Diagnosis not present

## 2023-10-05 DIAGNOSIS — R351 Nocturia: Secondary | ICD-10-CM | POA: Diagnosis not present

## 2023-10-05 DIAGNOSIS — G4733 Obstructive sleep apnea (adult) (pediatric): Secondary | ICD-10-CM | POA: Diagnosis not present

## 2023-10-05 DIAGNOSIS — I10 Essential (primary) hypertension: Secondary | ICD-10-CM | POA: Diagnosis not present

## 2023-11-12 DIAGNOSIS — Z713 Dietary counseling and surveillance: Secondary | ICD-10-CM | POA: Diagnosis not present

## 2023-11-12 DIAGNOSIS — K449 Diaphragmatic hernia without obstruction or gangrene: Secondary | ICD-10-CM | POA: Diagnosis not present

## 2023-11-12 DIAGNOSIS — Z6841 Body Mass Index (BMI) 40.0 and over, adult: Secondary | ICD-10-CM | POA: Diagnosis not present

## 2023-11-12 DIAGNOSIS — G4733 Obstructive sleep apnea (adult) (pediatric): Secondary | ICD-10-CM | POA: Diagnosis not present

## 2023-11-12 DIAGNOSIS — E66813 Obesity, class 3: Secondary | ICD-10-CM | POA: Diagnosis not present

## 2023-12-10 ENCOUNTER — Encounter (HOSPITAL_BASED_OUTPATIENT_CLINIC_OR_DEPARTMENT_OTHER): Payer: Self-pay | Admitting: Internal Medicine

## 2023-12-10 DIAGNOSIS — G4733 Obstructive sleep apnea (adult) (pediatric): Secondary | ICD-10-CM

## 2024-02-01 NOTE — Procedures (Signed)
 Orders only

## 2024-03-01 DIAGNOSIS — R0602 Shortness of breath: Secondary | ICD-10-CM | POA: Diagnosis not present

## 2024-03-01 DIAGNOSIS — Z13220 Encounter for screening for lipoid disorders: Secondary | ICD-10-CM | POA: Diagnosis not present

## 2024-03-01 DIAGNOSIS — Z1339 Encounter for screening examination for other mental health and behavioral disorders: Secondary | ICD-10-CM | POA: Diagnosis not present

## 2024-03-01 DIAGNOSIS — R5383 Other fatigue: Secondary | ICD-10-CM | POA: Diagnosis not present

## 2024-03-01 DIAGNOSIS — Z6841 Body Mass Index (BMI) 40.0 and over, adult: Secondary | ICD-10-CM | POA: Diagnosis not present

## 2024-03-01 DIAGNOSIS — Z1331 Encounter for screening for depression: Secondary | ICD-10-CM | POA: Diagnosis not present

## 2024-03-01 DIAGNOSIS — Z114 Encounter for screening for human immunodeficiency virus [HIV]: Secondary | ICD-10-CM | POA: Diagnosis not present

## 2024-03-01 DIAGNOSIS — I10 Essential (primary) hypertension: Secondary | ICD-10-CM | POA: Diagnosis not present

## 2024-03-01 DIAGNOSIS — Z0000 Encounter for general adult medical examination without abnormal findings: Secondary | ICD-10-CM | POA: Diagnosis not present

## 2024-03-01 DIAGNOSIS — B001 Herpesviral vesicular dermatitis: Secondary | ICD-10-CM | POA: Diagnosis not present

## 2024-03-01 DIAGNOSIS — G4733 Obstructive sleep apnea (adult) (pediatric): Secondary | ICD-10-CM | POA: Diagnosis not present

## 2024-03-01 DIAGNOSIS — E559 Vitamin D deficiency, unspecified: Secondary | ICD-10-CM | POA: Diagnosis not present

## 2024-03-08 DIAGNOSIS — G4733 Obstructive sleep apnea (adult) (pediatric): Secondary | ICD-10-CM | POA: Diagnosis not present

## 2024-03-08 DIAGNOSIS — Z6841 Body Mass Index (BMI) 40.0 and over, adult: Secondary | ICD-10-CM | POA: Diagnosis not present

## 2024-03-08 DIAGNOSIS — E66813 Obesity, class 3: Secondary | ICD-10-CM | POA: Diagnosis not present

## 2024-04-12 ENCOUNTER — Ambulatory Visit: Admitting: Dermatology
